# Patient Record
Sex: Male | Born: 1945 | Race: White | Hispanic: No | Marital: Married
Health system: Southern US, Community
[De-identification: ages and names within clinical notes are randomized; demographics above are authoritative.]

## PROBLEM LIST (undated history)

## (undated) DIAGNOSIS — I509 Heart failure, unspecified: Secondary | ICD-10-CM

## (undated) DIAGNOSIS — K219 Gastro-esophageal reflux disease without esophagitis: Secondary | ICD-10-CM

## (undated) DIAGNOSIS — E785 Hyperlipidemia, unspecified: Secondary | ICD-10-CM

## (undated) DIAGNOSIS — I629 Nontraumatic intracranial hemorrhage, unspecified: Secondary | ICD-10-CM

## (undated) DIAGNOSIS — J189 Pneumonia, unspecified organism: Secondary | ICD-10-CM

## (undated) DIAGNOSIS — I4891 Unspecified atrial fibrillation: Secondary | ICD-10-CM

## (undated) DIAGNOSIS — E119 Type 2 diabetes mellitus without complications: Secondary | ICD-10-CM

## (undated) HISTORY — PX: TRACHEOSTOMY: SUR1362

---

## 2018-11-05 ENCOUNTER — Emergency Department (HOSPITAL_COMMUNITY): Payer: Medicare Other

## 2018-11-05 ENCOUNTER — Encounter (HOSPITAL_COMMUNITY): Payer: Self-pay | Admitting: *Deleted

## 2018-11-05 ENCOUNTER — Inpatient Hospital Stay (HOSPITAL_COMMUNITY): Payer: Medicare Other

## 2018-11-05 ENCOUNTER — Inpatient Hospital Stay (HOSPITAL_COMMUNITY)
Admission: EM | Admit: 2018-11-05 | Discharge: 2018-11-30 | DRG: 064 | Disposition: E | Payer: Medicare Other | Source: Skilled Nursing Facility | Attending: Neurology | Admitting: Neurology

## 2018-11-05 DIAGNOSIS — D649 Anemia, unspecified: Secondary | ICD-10-CM | POA: Diagnosis present

## 2018-11-05 DIAGNOSIS — L8990 Pressure ulcer of unspecified site, unspecified stage: Secondary | ICD-10-CM | POA: Insufficient documentation

## 2018-11-05 DIAGNOSIS — I611 Nontraumatic intracerebral hemorrhage in hemisphere, cortical: Secondary | ICD-10-CM | POA: Diagnosis present

## 2018-11-05 DIAGNOSIS — I610 Nontraumatic intracerebral hemorrhage in hemisphere, subcortical: Secondary | ICD-10-CM

## 2018-11-05 DIAGNOSIS — I615 Nontraumatic intracerebral hemorrhage, intraventricular: Secondary | ICD-10-CM | POA: Diagnosis present

## 2018-11-05 DIAGNOSIS — E785 Hyperlipidemia, unspecified: Secondary | ICD-10-CM | POA: Diagnosis present

## 2018-11-05 DIAGNOSIS — R1310 Dysphagia, unspecified: Secondary | ICD-10-CM | POA: Diagnosis present

## 2018-11-05 DIAGNOSIS — I959 Hypotension, unspecified: Secondary | ICD-10-CM | POA: Diagnosis present

## 2018-11-05 DIAGNOSIS — E119 Type 2 diabetes mellitus without complications: Secondary | ICD-10-CM | POA: Diagnosis present

## 2018-11-05 DIAGNOSIS — G9382 Brain death: Secondary | ICD-10-CM | POA: Diagnosis present

## 2018-11-05 DIAGNOSIS — I4891 Unspecified atrial fibrillation: Secondary | ICD-10-CM | POA: Diagnosis present

## 2018-11-05 DIAGNOSIS — R4182 Altered mental status, unspecified: Secondary | ICD-10-CM | POA: Diagnosis present

## 2018-11-05 DIAGNOSIS — I629 Nontraumatic intracranial hemorrhage, unspecified: Secondary | ICD-10-CM | POA: Diagnosis not present

## 2018-11-05 DIAGNOSIS — G935 Compression of brain: Secondary | ICD-10-CM | POA: Diagnosis present

## 2018-11-05 DIAGNOSIS — Z930 Tracheostomy status: Secondary | ICD-10-CM

## 2018-11-05 DIAGNOSIS — Z8673 Personal history of transient ischemic attack (TIA), and cerebral infarction without residual deficits: Secondary | ICD-10-CM

## 2018-11-05 DIAGNOSIS — J339 Nasal polyp, unspecified: Secondary | ICD-10-CM | POA: Diagnosis present

## 2018-11-05 DIAGNOSIS — L89150 Pressure ulcer of sacral region, unstageable: Secondary | ICD-10-CM | POA: Diagnosis present

## 2018-11-05 DIAGNOSIS — I5040 Unspecified combined systolic (congestive) and diastolic (congestive) heart failure: Secondary | ICD-10-CM | POA: Diagnosis present

## 2018-11-05 DIAGNOSIS — J189 Pneumonia, unspecified organism: Secondary | ICD-10-CM | POA: Diagnosis present

## 2018-11-05 DIAGNOSIS — K219 Gastro-esophageal reflux disease without esophagitis: Secondary | ICD-10-CM | POA: Diagnosis present

## 2018-11-05 DIAGNOSIS — I110 Hypertensive heart disease with heart failure: Secondary | ICD-10-CM | POA: Diagnosis present

## 2018-11-05 DIAGNOSIS — E876 Hypokalemia: Secondary | ICD-10-CM | POA: Diagnosis present

## 2018-11-05 DIAGNOSIS — Z7901 Long term (current) use of anticoagulants: Secondary | ICD-10-CM | POA: Diagnosis not present

## 2018-11-05 DIAGNOSIS — Z66 Do not resuscitate: Secondary | ICD-10-CM | POA: Diagnosis present

## 2018-11-05 DIAGNOSIS — J9690 Respiratory failure, unspecified, unspecified whether with hypoxia or hypercapnia: Secondary | ICD-10-CM | POA: Diagnosis present

## 2018-11-05 DIAGNOSIS — G936 Cerebral edema: Secondary | ICD-10-CM | POA: Diagnosis present

## 2018-11-05 DIAGNOSIS — Z1159 Encounter for screening for other viral diseases: Secondary | ICD-10-CM

## 2018-11-05 DIAGNOSIS — Z794 Long term (current) use of insulin: Secondary | ICD-10-CM | POA: Diagnosis not present

## 2018-11-05 DIAGNOSIS — I619 Nontraumatic intracerebral hemorrhage, unspecified: Secondary | ICD-10-CM | POA: Diagnosis present

## 2018-11-05 HISTORY — DX: Hyperlipidemia, unspecified: E78.5

## 2018-11-05 HISTORY — DX: Heart failure, unspecified: I50.9

## 2018-11-05 HISTORY — DX: Unspecified atrial fibrillation: I48.91

## 2018-11-05 HISTORY — DX: Gastro-esophageal reflux disease without esophagitis: K21.9

## 2018-11-05 HISTORY — DX: Pneumonia, unspecified organism: J18.9

## 2018-11-05 HISTORY — DX: Nontraumatic intracranial hemorrhage, unspecified: I62.9

## 2018-11-05 HISTORY — DX: Type 2 diabetes mellitus without complications: E11.9

## 2018-11-05 LAB — CBC WITH DIFFERENTIAL/PLATELET
Abs Immature Granulocytes: 0.08 10*3/uL — ABNORMAL HIGH (ref 0.00–0.07)
Basophils Absolute: 0 10*3/uL (ref 0.0–0.1)
Basophils Relative: 0 %
Eosinophils Absolute: 0.1 10*3/uL (ref 0.0–0.5)
Eosinophils Relative: 1 %
HCT: 27.6 % — ABNORMAL LOW (ref 39.0–52.0)
Hemoglobin: 8.1 g/dL — ABNORMAL LOW (ref 13.0–17.0)
Immature Granulocytes: 1 %
Lymphocytes Relative: 10 %
Lymphs Abs: 1.2 10*3/uL (ref 0.7–4.0)
MCH: 25.2 pg — ABNORMAL LOW (ref 26.0–34.0)
MCHC: 29.3 g/dL — ABNORMAL LOW (ref 30.0–36.0)
MCV: 85.7 fL (ref 80.0–100.0)
Monocytes Absolute: 0.5 10*3/uL (ref 0.1–1.0)
Monocytes Relative: 5 %
Neutro Abs: 9.5 10*3/uL — ABNORMAL HIGH (ref 1.7–7.7)
Neutrophils Relative %: 83 %
Platelets: 275 10*3/uL (ref 150–400)
RBC: 3.22 MIL/uL — ABNORMAL LOW (ref 4.22–5.81)
RDW: 17.7 % — ABNORMAL HIGH (ref 11.5–15.5)
WBC: 11.4 10*3/uL — ABNORMAL HIGH (ref 4.0–10.5)
nRBC: 0 % (ref 0.0–0.2)

## 2018-11-05 LAB — CBG MONITORING, ED: Glucose-Capillary: 134 mg/dL — ABNORMAL HIGH (ref 70–99)

## 2018-11-05 LAB — COMPREHENSIVE METABOLIC PANEL
ALT: 22 U/L (ref 0–44)
AST: 21 U/L (ref 15–41)
Albumin: 1.9 g/dL — ABNORMAL LOW (ref 3.5–5.0)
Alkaline Phosphatase: 81 U/L (ref 38–126)
Anion gap: 9 (ref 5–15)
BUN: 17 mg/dL (ref 8–23)
CO2: 31 mmol/L (ref 22–32)
Calcium: 8.3 mg/dL — ABNORMAL LOW (ref 8.9–10.3)
Chloride: 96 mmol/L — ABNORMAL LOW (ref 98–111)
Creatinine, Ser: 0.81 mg/dL (ref 0.61–1.24)
GFR calc Af Amer: >60 mL/min (ref >60)
GFR calc non Af Amer: >60 mL/min (ref >60)
Glucose, Bld: 150 mg/dL — ABNORMAL HIGH (ref 70–99)
Potassium: 3.1 mmol/L — ABNORMAL LOW (ref 3.5–5.1)
Sodium: 136 mmol/L (ref 135–145)
Total Bilirubin: 0.3 mg/dL (ref 0.3–1.2)
Total Protein: 5.6 g/dL — ABNORMAL LOW (ref 6.5–8.1)

## 2018-11-05 LAB — SARS CORONAVIRUS 2 BY RT PCR (HOSPITAL ORDER, PERFORMED IN ~~LOC~~ HOSPITAL LAB): SARS Coronavirus 2: NEGATIVE

## 2018-11-05 LAB — LACTIC ACID, PLASMA
Lactic Acid, Venous: 1.1 mmol/L (ref 0.5–1.9)
Lactic Acid, Venous: 1.2 mmol/L (ref 0.5–1.9)

## 2018-11-05 LAB — MRSA PCR SCREENING: MRSA by PCR: NEGATIVE

## 2018-11-05 LAB — TSH: TSH: 0.591 u[IU]/mL (ref 0.350–4.500)

## 2018-11-05 MED ORDER — ACETAMINOPHEN 325 MG PO TABS: 650.0000 mg | ORAL_TABLET | Freq: Four times a day (QID) | ORAL | Status: DC | PRN

## 2018-11-05 MED ORDER — GLYCOPYRROLATE 1 MG PO TABS
1.0000 mg | ORAL_TABLET | ORAL | Status: DC | PRN
  Filled 2018-11-05: qty 1

## 2018-11-05 MED ORDER — MORPHINE SULFATE (PF) 2 MG/ML IV SOLN
1.0000 mg | INTRAVENOUS | Status: DC | PRN
  Filled 2018-11-05: qty 1

## 2018-11-05 MED ORDER — STROKE: EARLY STAGES OF RECOVERY BOOK
Freq: Once | Status: AC
  Administered 2018-11-05: 09:00:00

## 2018-11-05 MED ORDER — NOREPINEPHRINE 4 MG/250ML-% IV SOLN
0.0000 ug/min | INTRAVENOUS | Status: DC
  Administered 2018-11-05: 4 ug/min via INTRAVENOUS

## 2018-11-05 MED ORDER — SENNOSIDES-DOCUSATE SODIUM 8.6-50 MG PO TABS: 1.0000 | ORAL_TABLET | Freq: Two times a day (BID) | ORAL | Status: DC

## 2018-11-05 MED ORDER — GLYCOPYRROLATE 0.2 MG/ML IJ SOLN: 0.2000 mg | INTRAMUSCULAR | Status: DC | PRN

## 2018-11-05 MED ORDER — NOREPINEPHRINE 4 MG/250ML-% IV SOLN
INTRAVENOUS | Status: AC
  Administered 2018-11-05: 4 ug/min via INTRAVENOUS
  Filled 2018-11-05: qty 250

## 2018-11-05 MED ORDER — ACETAMINOPHEN 650 MG RE SUPP: 650.0000 mg | Freq: Four times a day (QID) | RECTAL | Status: DC | PRN

## 2018-11-05 MED ORDER — ACETAMINOPHEN 650 MG RE SUPP: 650.0000 mg | RECTAL | Status: DC | PRN

## 2018-11-05 MED ORDER — ACETAMINOPHEN 325 MG PO TABS: 650.0000 mg | ORAL_TABLET | ORAL | Status: DC | PRN

## 2018-11-05 MED ORDER — DIPHENHYDRAMINE HCL 50 MG/ML IJ SOLN: 25.0000 mg | INTRAMUSCULAR | Status: DC | PRN

## 2018-11-05 MED ORDER — COLLAGENASE 250 UNIT/GM EX OINT
TOPICAL_OINTMENT | Freq: Every day | CUTANEOUS | Status: DC
  Filled 2018-11-05: qty 30

## 2018-11-05 MED ORDER — SODIUM CHLORIDE 0.9 % IV BOLUS
1000.0000 mL | Freq: Once | INTRAVENOUS | Status: AC
  Administered 2018-11-05: 1000 mL via INTRAVENOUS

## 2018-11-05 MED ORDER — SODIUM CHLORIDE 0.9 % IV SOLN
2.0000 g | Freq: Once | INTRAVENOUS | Status: AC
  Administered 2018-11-05: 2 g via INTRAVENOUS
  Filled 2018-11-05: qty 2

## 2018-11-05 MED ORDER — VANCOMYCIN HCL 10 G IV SOLR
2000.0000 mg | Freq: Once | INTRAVENOUS | Status: AC
  Administered 2018-11-05: 07:00:00 2000 mg via INTRAVENOUS
  Filled 2018-11-05: qty 2000

## 2018-11-05 MED ORDER — PANTOPRAZOLE SODIUM 40 MG IV SOLR: 40.0000 mg | Freq: Every day | INTRAVENOUS | Status: DC

## 2018-11-05 MED ORDER — CHLORHEXIDINE GLUCONATE CLOTH 2 % EX PADS
6.0000 | MEDICATED_PAD | Freq: Every day | CUTANEOUS | Status: DC
  Administered 2018-11-05: 6 via TOPICAL

## 2018-11-05 MED ORDER — SODIUM CHLORIDE 0.9 % IV BOLUS
1000.0000 mL | Freq: Once | INTRAVENOUS | Status: AC
  Administered 2018-11-05: 05:00:00 1000 mL via INTRAVENOUS

## 2018-11-05 MED ORDER — POLYVINYL ALCOHOL 1.4 % OP SOLN
1.0000 [drp] | Freq: Four times a day (QID) | OPHTHALMIC | Status: DC | PRN
  Filled 2018-11-05: qty 15

## 2018-11-05 MED ORDER — VANCOMYCIN HCL IN DEXTROSE 1-5 GM/200ML-% IV SOLN: 1000.0000 mg | Freq: Once | INTRAVENOUS | Status: DC

## 2018-11-05 MED ORDER — SODIUM CHLORIDE 0.9 % IV SOLN
2.0000 g | Freq: Three times a day (TID) | INTRAVENOUS | Status: DC
  Filled 2018-11-05 (×3): qty 2

## 2018-11-05 MED ORDER — ACETAMINOPHEN 160 MG/5ML PO SOLN: 650.0000 mg | ORAL | Status: DC | PRN

## 2018-11-06 MED FILL — Norepinephrine Bitartrate IV Soln 1 MG/ML (Base Equivalent): INTRAVENOUS | Qty: 4 | Status: AC

## 2018-11-10 LAB — CULTURE, BLOOD (ROUTINE X 2)
Culture: NO GROWTH
Culture: NO GROWTH
Special Requests: ADEQUATE

## 2018-11-30 NOTE — Progress Notes (Signed)
SLP Cancellation Note  Patient Details Name: Gordon Lewis. MRN: [id number] DOB: Sep 22, 1945   Cancelled treatment:       Reason Eval/Treat Not Completed: Medical issues which prohibited therapy(Pt is currently intubated. SLP will follow up. )  Belal Scallon I. Hardin Negus, Berthold, Damon Office number [phone] Pager [place] 2018/11/16, 8:11 AM

## 2018-11-30 NOTE — Progress Notes (Signed)
Pt transported to 4N from ED via the ventilator. No complications during travel. Pt now in room 4N28.

## 2018-11-30 NOTE — ED Provider Notes (Signed)
[facility] Provider Note   CSN: [id number] Arrival date & time: 11/02/2018  0353    History   Chief Complaint Chief Complaint  Patient presents with   Altered Mental Status    HPI Gordon GritJohn Schrager Jr. is a 73 y.o. male.     HPI  This is a 73 year old male with a history of atrial fibrillation, heart failure, diabetes, hyperlipidemia, and intracranial hemorrhage who presents with altered mental status.  Patient lives at a long-term care facility, [facility].  He is trach dependent.  Normally he is responsive per the staff but they noted tonight that he was unresponsive.  He is currently being treated for pneumonia with vancomycin and meropenem.  He has had fevers on and off.  This is all per report and chart review.  Staff had noted that normally he is tachycardic but his pulse was 62 upon their arrival.  It appears that he was admitted to [facility] on June 17.  Confirmed that he is currently taking Eliquis.  I have reviewed limited available records.  Patient is unable to give any history.  Level 5 caveat.  Past Medical History:  Diagnosis Date   Atrial fibrillation (HCC)    CHF (congestive heart failure) (HCC)    Diabetes mellitus without complication (HCC)    GERD (gastroesophageal reflux disease)    Hyperlipidemia    Intracranial hemorrhage (HCC)    Pneumonia     Patient Active Problem List   Diagnosis Date Noted   ICH (intracerebral hemorrhage) (HCC) 11/16/2018      Home Medications    Prior to Admission medications   Medication Sig Start Date End Date Taking? Authorizing Provider  acetaminophen (TYLENOL) 325 MG tablet Place 650 mg into feeding tube every 4 (four) hours as needed for mild pain or fever.   Yes [provider]  amLODipine (NORVASC) 5 MG tablet Place 5 mg into feeding tube at bedtime.   Yes [provider]  apixaban (ELIQUIS) 5 MG TABS tablet Place 5 mg into feeding tube 2 (two) times daily.    Yes [provider]  atorvastatin (LIPITOR) 80 MG tablet Place 80 mg into feeding tube daily.   Yes [provider]  bisacodyl (DULCOLAX) 10 MG suppository Place 10 mg rectally See admin instructions. Every 2 days as needed for constipation   Yes [provider]  bromocriptine (PARLODEL) 2.5 MG tablet Place 2.5 mg into feeding tube every 8 (eight) hours as needed (neurostorming symptoms).   Yes [provider]  chlorhexidine (PERIDEX) 0.12 % solution Use as directed 5 mLs in the mouth or throat See admin instructions. By shift   Yes [provider]  digoxin (LANOXIN) 0.125 MG tablet Place 0.125 mg into feeding tube daily.   Yes [provider]  famotidine (PEPCID) 20 MG tablet Place 20 mg into feeding tube 2 (two) times daily.   Yes [provider]  FLUoxetine (PROZAC) 20 MG tablet Place 20 mg into feeding tube daily.   Yes [provider]  insulin glargine (LANTUS) 100 UNIT/ML injection Inject 5 Units into the skin daily.   Yes [provider]  insulin lispro (HUMALOG KWIKPEN) 100 UNIT/ML KwikPen Inject 3-20 Units into the skin every 6 (six) hours. 151-200=3 units 201-300=5 units 301-400=10 units 401-500=15 units 501-550=20 units   Yes [provider]  insulin lispro (INSULIN LISPRO) 100 UNIT/ML KwikPen Junior Inject 3 Units into the skin every 6 (six) hours.   Yes [provider]  Magnesium Oxide 400 (240 Mg) MG TABS Give 800 mg by tube 3 (three) times daily.   Yes [provider]  meropenem (MERREM) 1 g injection Inject 1 g into the muscle every 8 (eight) hours.   Yes [provider]  Multiple Vitamin (MULTIVITAMIN WITH MINERALS) TABS tablet Place 1 tablet into feeding tube daily.   Yes [provider]  Nutritional Supplements (PEPTAMEN AF PO) 55 mLs by Gastric Tube route See admin instructions. 0.0756 gram-1.2 kcal/ml Every shift   Yes [provider]  omega-3  acid ethyl esters (LOVAZA) 1 g capsule Place 1 g into feeding tube daily.   Yes [provider]  phenytoin (DILANTIN) 30 MG ER capsule 150 mg every 8 (eight) hours. Per Tube   Yes [provider]  Potassium Bicarb-Citric Acid (EFFER-K) 20 MEQ TBEF Give 20 mEq by tube daily.   Yes [provider]  propranolol (INDERAL) 40 MG tablet Place 40 mg into feeding tube every 6 (six) hours.   Yes [provider]  senna (SENOKOT) 8.6 MG tablet Place 2 tablets into feeding tube daily.   Yes [provider]  Vancomycin (VANCOCIN) 1.25 g SOLR injection Inject 1,250 mg into the vein 2 (two) times a day.   Yes [provider]  vitamin C (ASCORBIC ACID) 500 MG tablet Place 500 mg into feeding tube daily.   Yes [provider]  Jay Schlichter Oil (ARTIFICIAL TEARS) 83-15 % OINT Place 1 application into both eyes 2 (two) times a day.   Yes [provider]    Family History No family history on file.  Social History Social History   Tobacco Use   Smoking status: Not on file  Substance Use Topics   Alcohol use: Not on file   Drug use: Not on file     Allergies   Patient has no known allergies.   Review of Systems Review of Systems  Unable to perform ROS: Patient unresponsive     Physical Exam Updated Vital Signs BP 104/66    Pulse (!) 56    Temp (!) 93.7 F (34.3 C) (Rectal)    Resp 12    Wt 118.4 kg    SpO2 98%   Physical Exam Vitals signs and nursing note reviewed.  Constitutional:      Appearance: He is well-developed.     Comments: Unarousable, chronically ill-appearing  HENT:     Head: Normocephalic and atraumatic.     Mouth/Throat:     Mouth: Mucous membranes are dry.  Eyes:     Pupils: Pupils are equal, round, and reactive to light.     Comments: Pupils 5 mm and minimally reactive bilaterally  Neck:     Musculoskeletal: Neck supple.     Comments: Trach in place Cardiovascular:     Rate and  Rhythm: Regular rhythm. Bradycardia present.     Heart sounds: Normal heart sounds. No murmur.  Pulmonary:     Effort: Pulmonary effort is normal. No respiratory distress.     Breath sounds: Normal breath sounds. No wheezing.     Comments: On ventilator Abdominal:     General: Bowel sounds are normal.     Palpations: Abdomen is soft.     Tenderness: There is no abdominal tenderness. There is no rebound.  Musculoskeletal:     Right lower leg: Edema present.     Left lower leg: Edema present.  Skin:    General: Skin is warm and dry.  Neurological:  Comments: Somnolent, unarousable, does not withdrawal to painful stimuli, absent corneal  Psychiatric:     Comments: Unable to assess      ED Treatments / Results  Labs (all labs ordered are listed, but only abnormal results are displayed) Labs Reviewed  CBC WITH DIFFERENTIAL/PLATELET - Abnormal; Notable for the following components:      Result Value   WBC 11.4 (*)    RBC 3.22 (*)    Hemoglobin 8.1 (*)    HCT 27.6 (*)    MCH 25.2 (*)    MCHC 29.3 (*)    RDW 17.7 (*)    Neutro Abs 9.5 (*)    Abs Immature Granulocytes 0.08 (*)    All other components within normal limits  COMPREHENSIVE METABOLIC PANEL - Abnormal; Notable for the following components:   Potassium 3.1 (*)    Chloride 96 (*)    Glucose, Bld 150 (*)    Calcium 8.3 (*)    Total Protein 5.6 (*)    Albumin 1.9 (*)    All other components within normal limits  CBG MONITORING, ED - Abnormal; Notable for the following components:   Glucose-Capillary 134 (*)    All other components within normal limits  SARS CORONAVIRUS 2 (HOSPITAL ORDER, PERFORMED IN [facility])  CULTURE, BLOOD (ROUTINE X 2)  CULTURE, BLOOD (ROUTINE X 2)  LACTIC ACID, PLASMA  TSH  URINALYSIS, ROUTINE W REFLEX MICROSCOPIC  LACTIC ACID, PLASMA    EKG EKG Interpretation  Date/Time:  Tuesday November 05 2018 04:00:45 EDT Ventricular Rate:  55 PR Interval:    QRS  Duration: 223 QT Interval:  524 QTC Calculation: 502 R Axis:   99 Text Interpretation:  Atrial fibrillation RBBB and LPFB ST depr, consider ischemia, inferior leads No prior for comparison Confirmed by Ross MarcusHorton, Avory Rahimi ([id number]) on 11/20/2018 4:23:25 AM Also confirmed by Ross MarcusHorton, Dalary Hollar ([id number]), editor Barbette Hairassel, Kerry [phone]  on 11/27/2018 7:16:16 AM   Radiology Ct Head Wo Contrast  Result Date: 11/14/2018 CLINICAL DATA:  Found unresponsive at [facility]. Recent brain hemorrhage. Altered level of consciousness. EXAM: CT HEAD WITHOUT CONTRAST TECHNIQUE: Contiguous axial images were obtained from the base of the skull through the vertex without intravenous contrast. COMPARISON:  None. FINDINGS: Brain: Large parenchymal hemorrhage is present within the right corona radiata extending from the corpus callosum to the vertex measuring 6.6 x 6.4 x 6.8 cm. Hemorrhage extends into the corpus callosum and throughout the ventricles. Midline shift superiorly is up to 14 mm. There is marked effacement of the right lateral ventricle. Blood extends through the fourth ventricle and to the foramen magnum. There is diffuse effacement of the subarachnoid spaces. Asymmetric diffuse white matter edema surrounds the hemorrhage on the right. Basal cisterns are effaced. There is downward herniation of tissue into the foramen magnum. Vascular: Atherosclerotic calcifications are present within the cavernous internal carotid arteries bilaterally. There is no hyperdense vessel. Skull: Calvarium is intact. No focal lytic or blastic lesions are present. Sinuses/Orbits: Maxillary sinuses are opacified bilaterally. There is asymmetric opacification of anterior ethmoid air cells and the right frontal sinus. Polypoid lesions are present in the nasal cavity. Sphenoid sinuses are clear. Bilateral mastoid effusions are present, right greater than left. Right middle ear effusion is also present. IMPRESSION: 1. Large parenchymal hemorrhage involving the  right corona radiata and centrum semi ovale with extension into the corpus callosum and ventricles. 2. Midline shift measures up to 14 mm. 3. Diffuse effacement of extra-axial spaces and downward herniation of tissue  into the foramen magnum. 4. Asymmetric edema surrounding the hemorrhage in the right hemisphere. 5. Chronic sinus disease. 6. Nasal polyposis. Critical Value/emergent results were called by telephone at the time of interpretation on 11/12/2018 at 6:31 am to Dr. Ross MarcusOURTNEY Girtrude Enslin , who verbally acknowledged these results. Electronically Signed   By: Marin Robertshristopher  Mattern M.D.   On: Mar 16, 2019 06:32   Dg Chest Portable 1 View  Result Date: 11/17/2018 CLINICAL DATA:  Pneumonia. EXAM: PORTABLE CHEST 1 VIEW COMPARISON:  None. FINDINGS: Tracheostomy tube in place. PICC tip at the cavoatrial junction. Cardiomegaly. Density at the left lung base probably represents a combination of pleural effusion and lung consolidation. Right lung is clear. Multiple dense nodules at the left hilum could represent calcified granulomas. No acute bone abnormality. IMPRESSION: Consolidation/effusion at the left lung base. Electronically Signed   By: Francene BoyersJames  Maxwell M.D.   On: Mar 16, 2019 06:58    Procedures Procedures (including critical care time)  CRITICAL CARE Performed by: Shon Batonourtney F Ahtziri Jeffries   Total critical care time: 70 minutes  Critical care time was exclusive of separately billable procedures and treating other patients.  Critical care was necessary to treat or prevent imminent or life-threatening deterioration.  Critical care was time spent personally by me on the following activities: development of treatment plan with patient and/or surrogate as well as nursing, discussions with consultants, evaluation of patient's response to treatment, examination of patient, obtaining history from patient or surrogate, ordering and performing treatments and interventions, ordering and review of laboratory studies,  ordering and review of radiographic studies, pulse oximetry and re-evaluation of patient's condition.   Medications Ordered in ED Medications  vancomycin (VANCOCIN) 2,000 mg in sodium chloride 0.9 % 500 mL IVPB (2,000 mg Intravenous New Bag/Given 11/06/2018 0631)   stroke: mapping our early stages of recovery book (has no administration in time range)  acetaminophen (TYLENOL) tablet 650 mg (has no administration in time range)    Or  acetaminophen (TYLENOL) solution 650 mg (has no administration in time range)    Or  acetaminophen (TYLENOL) suppository 650 mg (has no administration in time range)  senna-docusate (Senokot-S) tablet 1 tablet (has no administration in time range)  pantoprazole (PROTONIX) injection 40 mg (has no administration in time range)  sodium chloride 0.9 % bolus 1,000 mL (0 mLs Intravenous Stopped 11/04/2018 0449)  ceFEPIme (MAXIPIME) 2 g in sodium chloride 0.9 % 100 mL IVPB (0 g Intravenous Stopped 11/18/2018 0548)     Initial Impression / Assessment and Plan / ED Course  I have reviewed the triage vital signs and the nursing notes.  Pertinent labs & imaging results that were available during my care of the patient were reviewed by me and considered in my medical decision making (see chart for details).        Patient presents with worsening altered mental status.  On my exam he is obtunded and unresponsive with a very poor neurologic exam.  He is currently being treated for pneumonia.  He was noted to be bradycardic hypothermic.  Broad work-up initiated including sepsis work-up and repeat CT scan of the head given history of bleed.  I received a phone call from radiology.  Patient with a massive intracranial bleed with significant midline shift and herniation.  I discussed the patient with Dr. Maurice Smallstergard, neurosurgery who states that this is not a survivable injury and not amenable to neurosurgery.  Neurology was also consulted.  Indication for [facility] given neurologic exam.   Their exam is consistent with brain  death.  I have kept the family updated.  I spoke to the patient's daughter and son-in-law.  I was unable to get up with his wife but they are contacting her.  I discussed with them his poor prognosis and likely that this was an end-of-life event.  They have expressed that they would like everything done to save his life.  I have discussed this with the neurology team.  Family is in route to the hospital.  However, they have a 5-hour drive. Wife Gordon Lewis) [phone] Daughter Gordon Lewis) [phone] SIL Gordon Lewis) [phone] (preferred contact)  Final Clinical Impressions(s) / ED Diagnoses   Final diagnoses:  Intracranial hemorrhage [facility])    ED Discharge Orders    None       Shon Baton, MD 2018-11-28 [phone]

## 2018-11-30 NOTE — ED Triage Notes (Signed)
Pt arrives from [facility] via [facility]. Pt has been at [facility] after sustaining a fall +head bleed. Pt has been being treated for PNA with vanc. Tonight, on rounds, pt was unresponsive, hr 50-60's (normal 130's) & BP in the 60's. Levophed was initiated at 15mcg into Right PICC. #22 in the right hand (NSL). Pt is vent dependent trach, sats have been 98-99% on vent. Pt is cool to touch, unknown temp. Pt has PEG, foley.

## 2018-11-30 NOTE — Progress Notes (Addendum)
Patient arrived to 4N28 from ED around 0800. On assessment patient did not respond to pain, pupils non reactive, GCS 3.  CDS notified.  Patient found to have a unstagable wound on sacral.  Patient from [facility] with chronic Trach, foley and PEG.  See documentation. Wound consult placed.  Clarified with Stroke MD about blood pressure parameter, systolic less than 972.  Stroke MD waiting for family to arrive to talk about goals of care.  RN to continue to monitor.

## 2018-11-30 NOTE — Progress Notes (Signed)
Pt taken off of ventilator for comfort care per family request and physician order. Pt suctioned via trach prior to cuff being deflated. Family remained at bedside. Pt tolerated fairly well. SpO2, HR and RR acceptable at this time but declined as anticipated.

## 2018-11-30 NOTE — Progress Notes (Signed)
TCD completed. Tamar Miano, BS, RDMS, RVT  

## 2018-11-30 NOTE — ED Notes (Signed)
Per CT, CT room will not be ready until 5:30am.

## 2018-11-30 NOTE — Progress Notes (Signed)
STROKE TEAM PROGRESS NOTE   INTERVAL HISTORY I have personally reviewed history of presenting illness via review of electronic medical records and imaging films in PACS.  I also spoke to the patient's wife initially over the phone and subsequently at the bedside after arrival and her daughter and son-in-law.  He unfortunately has had a devastating intracerebral hemorrhage with significant mass-effect midline shift and brain herniation and with declining blood pressure and heart rate exam is nearing brain death.  Transcranial Doppler study was performed at the bedside and showed diastolic flow reversal in the middle cerebral artery as well as high resistance and M2 waveforms indicative of near brain death state  Vitals:   30-Nov-2018 1030 11-30-18 1045 2018-11-30 1100 Nov 30, 2018 1200  BP: 108/65 105/61 113/63 116/62  Pulse: (!) 47 (!) 50 (!) 55 (!) 53  Resp: '12 12 12 12  '$ Temp:    98.9 F (37.2 C)  TempSrc:    Axillary  SpO2: 100% 100% 100% 99%  Weight:        CBC:  Recent Labs  Lab 2018/11/30 0426  WBC 11.4*  NEUTROABS 9.5*  HGB 8.1*  HCT 27.6*  MCV 85.7  PLT 416    Basic Metabolic Panel:  Recent Labs  Lab 11-30-18 0426  NA 136  K 3.1*  CL 96*  CO2 31  GLUCOSE 150*  BUN 17  CREATININE 0.81  CALCIUM 8.3*    IMAGING Ct Head Wo Contrast  Result Date: 30-Nov-2018 CLINICAL DATA:  Found unresponsive at [place]. Recent brain hemorrhage. Altered level of consciousness. EXAM: CT HEAD WITHOUT CONTRAST TECHNIQUE: Contiguous axial images were obtained from the base of the skull through the vertex without intravenous contrast. COMPARISON:  None. FINDINGS: Brain: Large parenchymal hemorrhage is present within the right corona radiata extending from the corpus callosum to the vertex measuring 6.6 x 6.4 x 6.8 cm. Hemorrhage extends into the corpus callosum and throughout the ventricles. Midline shift superiorly is up to 14 mm. There is marked effacement of the right lateral ventricle. Blood extends  through the fourth ventricle and to the foramen magnum. There is diffuse effacement of the subarachnoid spaces. Asymmetric diffuse white matter edema surrounds the hemorrhage on the right. Basal cisterns are effaced. There is downward herniation of tissue into the foramen magnum. Vascular: Atherosclerotic calcifications are present within the cavernous internal carotid arteries bilaterally. There is no hyperdense vessel. Skull: Calvarium is intact. No focal lytic or blastic lesions are present. Sinuses/Orbits: Maxillary sinuses are opacified bilaterally. There is asymmetric opacification of anterior ethmoid air cells and the right frontal sinus. Polypoid lesions are present in the nasal cavity. Sphenoid sinuses are clear. Bilateral mastoid effusions are present, right greater than left. Right middle ear effusion is also present. IMPRESSION: 1. Large parenchymal hemorrhage involving the right corona radiata and centrum semi ovale with extension into the corpus callosum and ventricles. 2. Midline shift measures up to 14 mm. 3. Diffuse effacement of extra-axial spaces and downward herniation of tissue into the foramen magnum. 4. Asymmetric edema surrounding the hemorrhage in the right hemisphere. 5. Chronic sinus disease. 6. Nasal polyposis. Critical Value/emergent results were called by telephone at the time of interpretation on 11-30-2018 at 6:31 am to Dr. Thayer Jew , who verbally acknowledged these results. Electronically Signed   By: San Morelle M.D.   On: 11-30-18 06:32   Dg Chest Portable 1 View  Result Date: 11-30-2018 CLINICAL DATA:  Pneumonia. EXAM: PORTABLE CHEST 1 VIEW COMPARISON:  None. FINDINGS: Tracheostomy tube in place.  PICC tip at the cavoatrial junction. Cardiomegaly. Density at the left lung base probably represents a combination of pleural effusion and lung consolidation. Right lung is clear. Multiple dense nodules at the left hilum could represent calcified granulomas. No acute  bone abnormality. IMPRESSION: Consolidation/effusion at the left lung base. Electronically Signed   By: Lorriane Shire M.D.   On: 2018/11/09 06:58   Vas [place] Transcranial Doppler  Result Date: 09-Nov-2018  Transcranial Doppler Indications: Hemorrhagic stroke. History: Large parenchymal hemorrhage involving the right corona radiata and centrum semi ovale with extension into the corpus callosum and ventricles. Midline shift measures up to 14 mm. Performing Technologist: June Leap RDMS, RVT Supporting Technologist: Abram Sander RVS  Examination Guidelines: A complete evaluation includes B-mode imaging, spectral Doppler, color Doppler, and power Doppler as needed of all accessible portions of each vessel. Bilateral testing is considered an integral part of a complete examination. Limited examinations for reoccurring indications may be performed as noted.  +----------+------------+----------+-----------+-------------------------------+ RIGHT TCD   Right VM  Depth (cm)Pulsatility            Comment                           (cm)                                                         +----------+------------+----------+-----------+-------------------------------+ MCA          70.00                            high resistant with flow                                                           reversal             +----------+------------+----------+-----------+-------------------------------+ Opthalmic    20.00                                 high resistant          +----------+------------+----------+-----------+-------------------------------+ ICA siphon   20.00                                 high resistant          +----------+------------+----------+-----------+-------------------------------+  +----------+------------+---------+-----------+--------------------------------+ LEFT TCD  Left VM (cm)  Depth  Pulsatility            Comment                                       (cm)                                               +----------+------------+---------+-----------+--------------------------------+ ICA siphon   60.00  high resistant with flow                                                           reversal             +----------+------------+---------+-----------+--------------------------------+ Distal ICA   50.00                                 high resistant          +----------+------------+---------+-----------+--------------------------------+  Summary:  Not all vessels could be insonated but highly resistant low amplitude waveforms throughout.Abnormal right MCA waveform with flow revesal in diatole indicates significantly elevated pulsatility indices and near cerebral circiulatory arrest situation. *See table(s) above for measurements and observations.  Diagnosing physician: Antony Contras MD Electronically signed by Antony Contras MD on 12/03/18 at 12:01:31 PM.    Final     PHYSICAL EXAM Elderly Caucasian male who has tracheostomy and is on a ventilator.  He is unresponsive. . Afebrile. Head is nontraumatic. Neck is supple without bruit.    Cardiac exam no murmur or gallop. Lungs are clear to auscultation. Distal pulses are well felt. Neurological Exam :  Patient is intubated not sedated.  Comatose and unresponsive.  Eyes are closed.  Pupils are both large 7 mm fixed nonreactive.  Doll's eye movements absent.  Corneal reflexes absent bilaterally.  Patient has no cough and gag.  No response to sternal rub or nailbed pressure.  No voluntary or involuntary motor response noted.  Plantars are both mute.  ASSESSMENT/PLAN Mr. Gordon Lewis. is a 73 y.o. 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 fib previously on anticoagulation presenting from [facility] being unresponsive with fixed and dilated pupils.   Stroke: Large right ICH with IVH and midline shift with cytotoxic cerebral edema and herniation,  not survivable - hemorrhage occurred while on Eliquis for known A. fib status post ICH in May 2020  CT head large right corona radiata and centrum semiovale ICH with extension into the corpus callosum and ventricles.  Midline shift 14 mm.  Effacement of extra-axial spaces and downward herniation into the foramen magnum.  Asymmetric edema surrounding hemorrhage and right hemisphere.  Chronic sinus disease.  Nasal polyposis.  Neurosurgery consulted.  Not a surgical candidate   Anticoagulation not reversed as this hemorrhage is not survivable  Progressive herniation with hypotension since admission  TCD shows flow reversal in the vessels  Per [facility] spoke with wife who made him DNR  Treat for survival until family arrival  SCDs for VTE prophylaxis  Eliquis (apixaban) daily prior to admission, now on No antithrombotic given hmg  Wife and family currently on the way to the hospital and should be here within the next 2-1/2 to 3 hours  Vent dependent Respiratory Failure  Intubated  Atrial Fibrillation  Home anticoagulation:  Eliquis (apixaban) daily   Off AC d/t hmg   Hypertension Hypotension  SBP goal < 140  Fluid bolus  Levophed for low BP  Hyperlipidemia  Home meds:  lipitor 80, lovaza  Diabetes type II   Home meds:  lanuts 5, SSI  CBGs  SSI  Dysphagia . Secondary to stroke . NPO  Other Stroke Risk Factors  Advanced age  Obesity  Hx stroke/TIA  07/2018 - R frontoparietal ICH w/ IVH on xarelto, reversed w/ TXA, Andexxa. D/c'd 14 days on eliquis. Sacral wound.   Combined systolic and diastolic congestive heart failure  Other Active Problems  Leukocytosis WBC 11.4  Anemia hemoglobin 8.1  Hypokalemia K 3.1  Hospital day # 0  I have personally obtained history,examined this patient, reviewed notes, independently viewed imaging studies, participated in medical decision making and plan of care.ROS completed by me personally and pertinent positives  fully documented  I have made any additions or clarifications directly to the above note.  Patient unfortunately has had a massive intracerebral hemorrhage with transtentorial, transfalcine  And central herniation with brainstem compression with poor neurological exam compatible with near brain death exam and declining vital signs.  I had a long discussion with patient's wife initially over the phone and subsequently met with her at the bedside upon arrival and explained poor prognosis and she agreed to DNR and withdrawal of ventilatory support.  This will be done according to protocol and patient will be kept comfortable. This patient is critically ill and at significant risk of neurological worsening, death and care requires constant monitoring of vital signs, hemodynamics,respiratory and cardiac monitoring, extensive review of multiple databases, frequent neurological assessment, discussion with family, other specialists and medical decision making of high complexity.I have made any additions or clarifications directly to the above note.This critical care time does not reflect procedure time, or teaching time or supervisory time of PA/NP/Med Resident etc but could involve care discussion time.  I spent 40 minutes of neurocritical care time  in the care of  this patient.     Antony Contras, MD Medical Director [facility] Pager: [phone] 11/09/2018 1:43 PM   To contact Stroke Continuity provider, please refer to [email or web address] After hours, contact General Neurology

## 2018-11-30 NOTE — H&P (Signed)
Chief Complaint: Found Unresponsive  History obtained from: Chart  And ED physician  HPI:                                                                                                                                       Gordon GritJohn Vanderveer Jr. is a 73 y.o. male with past medical history of a recent frontal hemorrhage, atrial fibrillation previously on anticoagulation,  status post [facility]ach [facility]  [facility] after being found unresponsive.  History was obtained by reviewing [facility].  Patient was discharged from outside hospital following ICH while on Xarelto.  He was discharged to [facility] on June 17 and Eliquis was restarted at the time of admission.  Patient was found unresponsive earlier today and EMS was called.  Last known normal unclear.  On arrival patient had fixed and dilated pupils, completely unresponsive and not breathing over the ventilator.  A stat CT head was performed which showed a very large right parenchymal hemorrhage with intraventricular extension and 14 mm midline shift and downward tonsillar herniation.  EDP consulted neurosurgery who felt this was nonsurvivable and not a candidate for any neurosurgical intervention.  Upon assessment, patient comatose with absent cranial nerve reflexes (caoloric test not performed), flaccid in all 4 extremities.  Patient was not breathing over the vent.  Anticoagulation not reversed as this is nonsurvivable and would be futile care.  Date last known well: 7.6.20 Time last known well: Unknown tPA Given: No, hemorrhage   Intracerebral Hemorrhage (ICH) Score  Glascow Coma Score  3-4  +2  Age >/= 80 yes no 0  ICH volume >/= 30ml  yes +1  IVH yes +1  Infratentorial origin no 0 Total:  4   Past Medical History:  Diagnosis Date  . Atrial fibrillation (HCC)   . CHF (congestive heart failure) (HCC)   . Diabetes mellitus without complication (HCC)   . GERD  (gastroesophageal reflux disease)   . Hyperlipidemia   . Intracranial hemorrhage (HCC)   . Pneumonia       No family history on file. No pertinent family history to patient's presentation. Social History:  has no history on file for tobacco, alcohol, and drug.  Allergies: No Known Allergies  Medications:  I reviewed medications [facility]   ROS:                                                                                                                                     Unable to obtain as patient is unresponsive   Examination:                                                                                                      General: Appears well-developed . Psych: Unable to obtain as patient is comatose Eyes: No scleral injection HENT: No OP obstrucion, trach in place Head: Normocephalic.  Cardiovascular: Bradycardic with regular rhythm. Respiratory: No spontaneous effort GI: Soft.  No distension. There is no tenderness.  Skin: WDI    Neurological Examination Mental Status: Patient does not respond to verbal stimuli.  Does not respond to deep sternal rub.  Does not follow commands.  No verbalizations noted.  Cranial Nerves: II: patient does not respond confrontation bilaterally, pupils not reactive, right 8 mm, left 8 mm III,IV,VI: doll's response absent bilaterally.  V,VII: corneal reflex: absent VIII: patient does not respond to verbal stimuli IX,X: gag reflex : absent XI: trapezius strength unable to test bilaterally XII: tongue strength unable to test Motor: Extremities flaccid throughout.  No spontaneous movement noted.  No purposeful movements noted.  Sensory: Does not respond to noxious stimuli in any extremity. Deep Tendon Reflexes:  Absent throughout. Plantars: mute Cerebellar: Unable to perform     Lab  Results: Basic Metabolic Panel: Recent Labs  Lab 2018/06/26 0426  NA 136  K 3.1*  CL 96*  CO2 31  GLUCOSE 150*  BUN 17  CREATININE 0.81  CALCIUM 8.3*    CBC: Recent Labs  Lab 2018/06/26 0426  WBC 11.4*  NEUTROABS 9.5*  HGB 8.1*  HCT 27.6*  MCV 85.7  PLT 275    Coagulation Studies: No results for input(s): LABPROT, INR in the last 72 hours.  Imaging: Ct Head Wo Contrast  Result Date: 11/19/2018 CLINICAL DATA:  Found unresponsive at [facility]. Recent brain hemorrhage. Altered level of consciousness. EXAM: CT HEAD WITHOUT CONTRAST TECHNIQUE: Contiguous axial images were obtained from the base of the skull through the vertex without intravenous contrast. COMPARISON:  None. FINDINGS: Brain: Large parenchymal hemorrhage is present within the right corona radiata extending from the corpus callosum to the vertex measuring 6.6 x 6.4 x 6.8 cm. Hemorrhage extends into the corpus callosum and throughout the ventricles. Midline shift superiorly is up to  14 mm. There is marked effacement of the right lateral ventricle. Blood extends through the fourth ventricle and to the foramen magnum. There is diffuse effacement of the subarachnoid spaces. Asymmetric diffuse white matter edema surrounds the hemorrhage on the right. Basal cisterns are effaced. There is downward herniation of tissue into the foramen magnum. Vascular: Atherosclerotic calcifications are present within the cavernous internal carotid arteries bilaterally. There is no hyperdense vessel. Skull: Calvarium is intact. No focal lytic or blastic lesions are present. Sinuses/Orbits: Maxillary sinuses are opacified bilaterally. There is asymmetric opacification of anterior ethmoid air cells and the right frontal sinus. Polypoid lesions are present in the nasal cavity. Sphenoid sinuses are clear. Bilateral mastoid effusions are present, right greater than left. Right middle ear effusion is also present. IMPRESSION: 1. Large parenchymal hemorrhage  involving the right corona radiata and centrum semi ovale with extension into the corpus callosum and ventricles. 2. Midline shift measures up to 14 mm. 3. Diffuse effacement of extra-axial spaces and downward herniation of tissue into the foramen magnum. 4. Asymmetric edema surrounding the hemorrhage in the right hemisphere. 5. Chronic sinus disease. 6. Nasal polyposis. Critical Value/emergent results were called by telephone at the time of interpretation on 11-07-18 at 6:31 am to Dr. Thayer Jew , who verbally acknowledged these results. Electronically Signed   By: San Morelle M.D.   On: 11-07-2018 06:32   Dg Chest Portable 1 View  Result Date: 2018/11/07 CLINICAL DATA:  Pneumonia. EXAM: PORTABLE CHEST 1 VIEW COMPARISON:  None. FINDINGS: Tracheostomy tube in place. PICC tip at the cavoatrial junction. Cardiomegaly. Density at the left lung base probably represents a combination of pleural effusion and lung consolidation. Right lung is clear. Multiple dense nodules at the left hilum could represent calcified granulomas. No acute bone abnormality. IMPRESSION: Consolidation/effusion at the left lung base. Electronically Signed   By: Lorriane Shire M.D.   On: November 07, 2018 06:58     ASSESSMENT AND PLAN   Large intraparenchymal hemorrhage in the right corona radiata with intraventricular extension including the fourth ventricle, 14 mm midline shift and diffuse cerebral edema with downward tonsillar herniation  Stroke is non survivable, patient already herniated Anticoagulation not reversed as this would be futile care Neurosurgery consulted, no role for neurosurgical intervention given poor prognosis Goals of care discussion when family arrives.    This patient is neurologically critically ill due to large [place] with IVH, cerebral edema and tonsillar herniation.  Patient is very high risk of death.   I spent 40  minutes of neurocritical time in the care of this patient including discussing  poor prognosis with the family (spoke to daughter over the phone and family is on their way).        [facility] Pager Number [phone]

## 2018-11-30 NOTE — Discharge Summary (Signed)
Patient ID: Gordon Lewis. MRN: [id number] DOB/AGE: 1946/01/11 73 y.o.  Admit date: 12-03-18 Death date: 12/03/2018 at 41  Admission Diagnoses: Unresponsive  Cause of Death:   Large right ICH with IVH and midline shift with cytotoxic cerebral edema and herniation, not survivable - hemorrhage occurred while on Eliquis for known A. fib patient made DNR and comfort care by family and ventilatory support withdrawn  Pertinent Medical Diagnosis: Active Problems:   ICH (intracerebral hemorrhage) ([place])   Pressure injury of skin   Hospital Course: Gordon Lewis. is a 73 y.o. 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 fib previously on anticoagulation presenting from [place] being unresponsive with fixed and dilated pupils. Patient was discharged from outside hospital following [place] while on Xarelto.  He was discharged to [place] on June 17 and Eliquis was restarted at the time of admission.  Patient was found unresponsive earlier today and EMS was called.  Last known normal unclear.  On arrival patient had fixed and dilated pupils, completely unresponsive and not breathing over the ventilator.  A stat CT head was performed which showed a very large right parenchymal hemorrhage with intraventricular extension and 14 mm midline shift and downward tonsillar herniation.  EDP consulted neurosurgery who felt this was nonsurvivable and not a candidate for any neurosurgical intervention. Upon assessment, patient comatose with absent cranial nerve reflexes (caoloric test not performed), flaccid in all 4 extremities.  Patient was not breathing over the vent.  Anticoagulation not reversed as this is nonsurvivable and would be futile care.  ICH score was 4 on admission I also spoke to the patient's wife initially over the phone and subsequently at the bedside after arrival and her daughter and son-in-law.  He unfortunately has had a devastating intracerebral hemorrhage with significant  mass-effect midline shift and brain herniation and with declining blood pressure and heart rate exam is nearing brain death.  Transcranial Doppler study was performed at the bedside and showed diastolic flow reversal in the middle cerebral artery as well as high resistance and M2 waveforms indicative of near brain death state.  Family understood poor prognosis and agreed to DNR and withdrawal of ventilatory support which was done and patient was kept comfortable and passed away shortly thereafter. Signed: Antony Contras 11/13/2018, 9:18 AM

## 2018-11-30 NOTE — Consult Note (Signed)
[facility] Nurse wound consult note Reason for Consult: Consult requested for sacrum wound.  Pt is critically ill with multiple systemic factors with can impair healing. 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  Pt is frequently incontinent of loose stools and it is difficult to keep area from becoming soiled Conservative sharp wound debridement (CSWD performed at the bedside): Using scissors and forcepts, removed outer layer of loose slough/eschar, revealing further layer of slough beneath with patchy areas of red moist woundbed. Pt tolerated without apparent discomfort and no bleeding. Dressing procedure/placement/frequency:  PT to begin hydrotherapy to assist with removal of nonviable tissue.  Pt is on an air mattress to reduce pressure. Apply Santyl to sacrum wound Q day, then cover with moist gauze packing and foam dressing.  [place] team will reassess weekly with PT during hydrotherapy. Julien Girt MSN, RN, [place], [place], [place]

## 2018-11-30 NOTE — ED Notes (Signed)
RN and pt back from CT 

## 2018-11-30 NOTE — Progress Notes (Signed)
Called and updated CDS.  RN to continue to monitor.

## 2018-11-30 NOTE — Progress Notes (Signed)
Patient's time of death 8416 pronounce by Morey Hummingbird and [facility] RN.   Family at bedside.  Attending MD and CDS notified.

## 2018-11-30 NOTE — Progress Notes (Signed)
Patient became hypotensive, neurology MD notified.  MD came to assess patient and updated family.  Verbal order for levophed and 1 liter bolus.  Code status changed per neurology MD.  RN to continue to monitor.

## 2018-11-30 DEATH — deceased

## 2021-02-04 IMAGING — CT CT HEAD WITHOUT CONTRAST
3 series · 14 of 47 positions shown, 16 images · non-contrast
Comparison: None.

CLINICAL DATA: Found unresponsive at [place]. Recent brain
hemorrhage. Altered level of consciousness.

EXAM:
CT HEAD WITHOUT CONTRAST
TECHNIQUE: Contiguous axial images were obtained from the base of the skull
through the vertex without intravenous contrast.

[Series 3: head 5.0 h30s · axial · 0.46mm/px · z∈[-119,+31]mm · 8 of 36 slices shown, 10 images]
[im 3/36  brain]
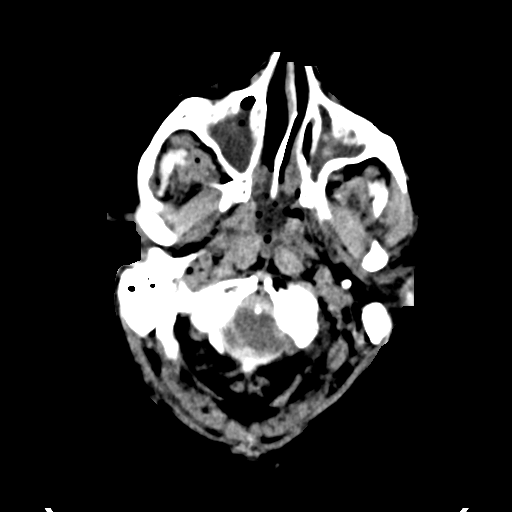
[im 3/36  bone]
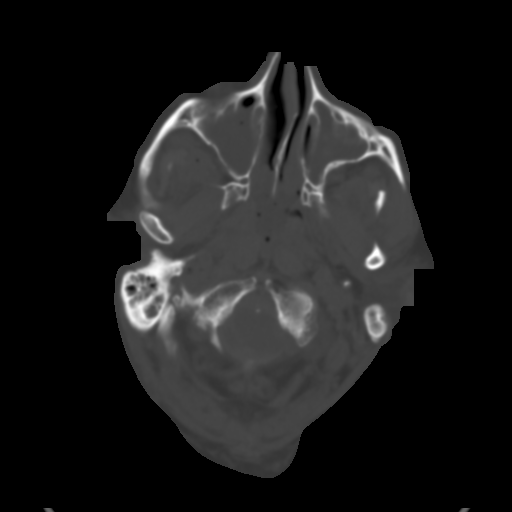
[im 8/36  brain]
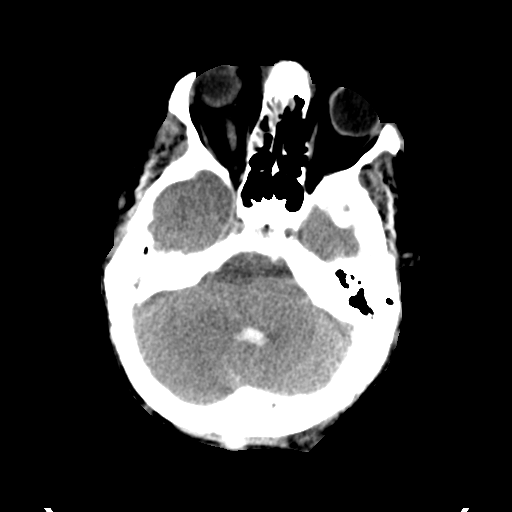
[im 11/36  brain]
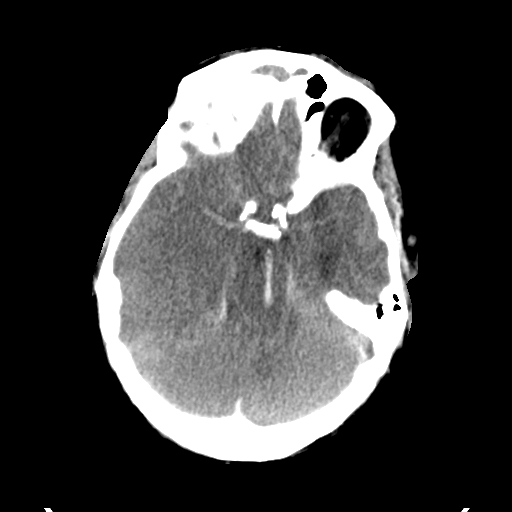
[im 16/36  brain]
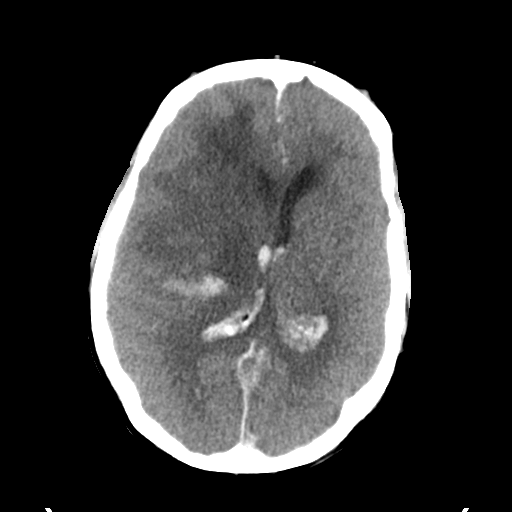
[im 20/36  brain]
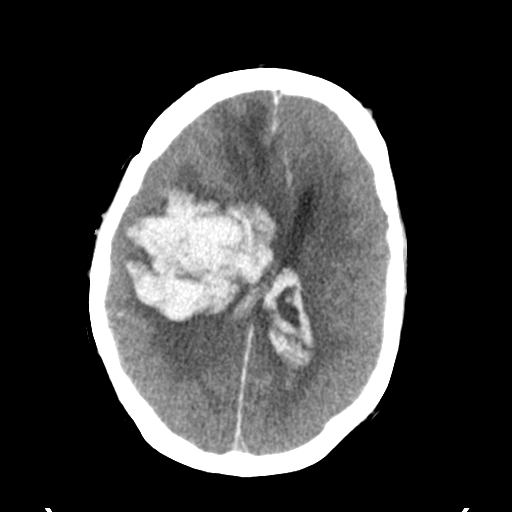
[im 20/36  bone]
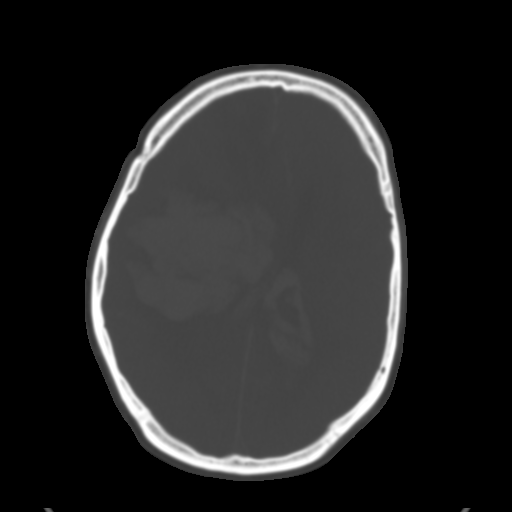
[im 25/36  brain]
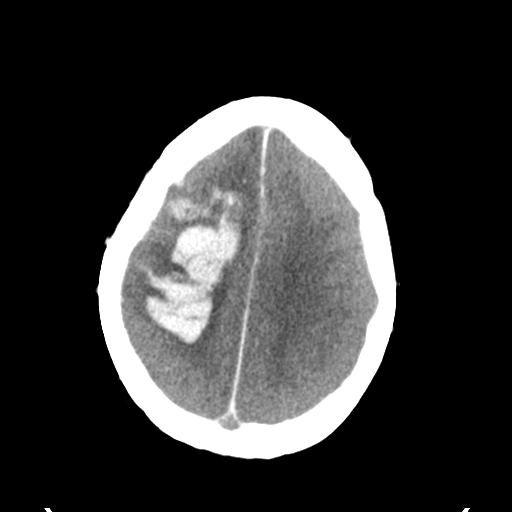
[im 28/36  brain]
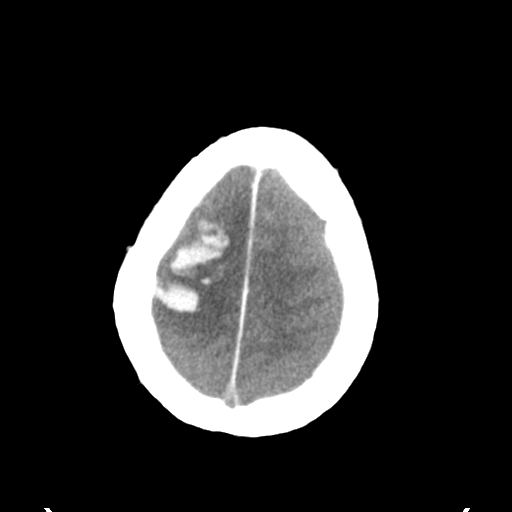
[im 33/36  brain]
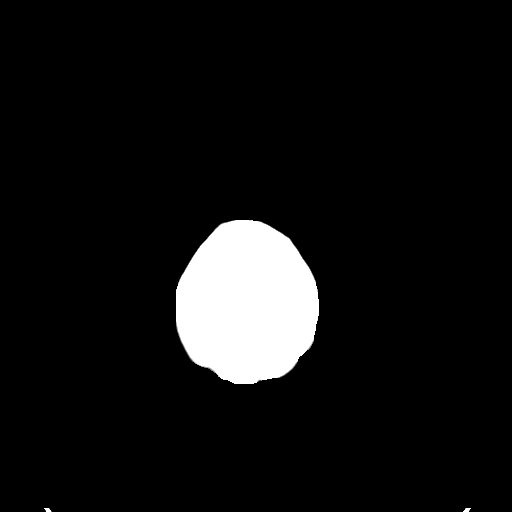

[Series 5: head 3.0 mpr cor · coronal · 0.37mm/px · 3 of 73 slices shown]
[im 25/73  brain]
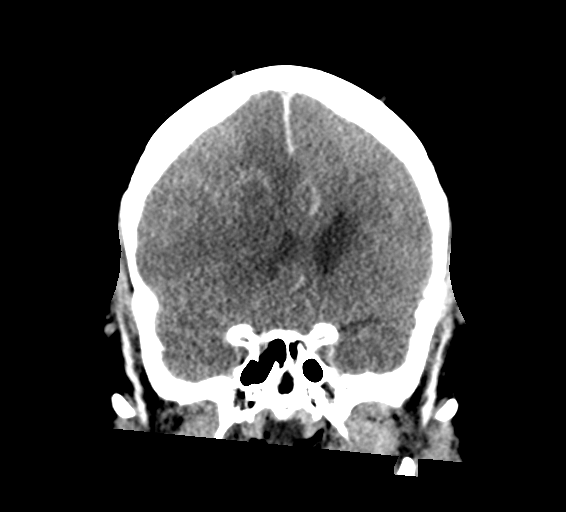
[im 33/73  brain]
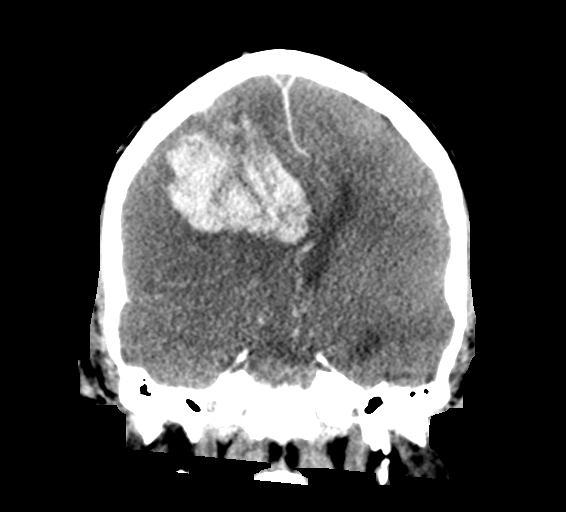
[im 41/73  brain]
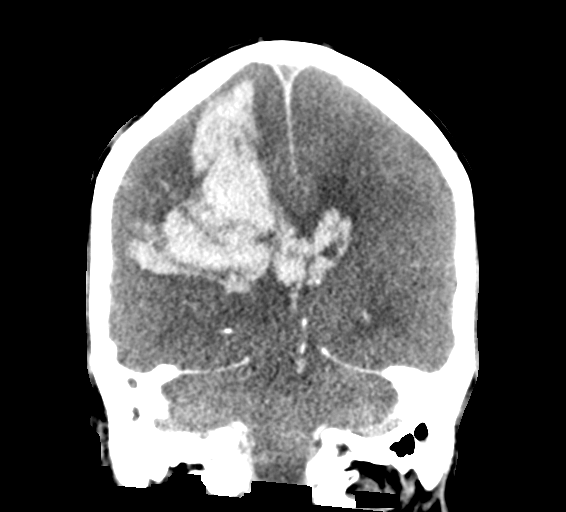

[Series 6: head 3.0 mpr sag · sagittal · 0.35mm/px · 3 of 63 slices shown]
[im 21/63  brain]
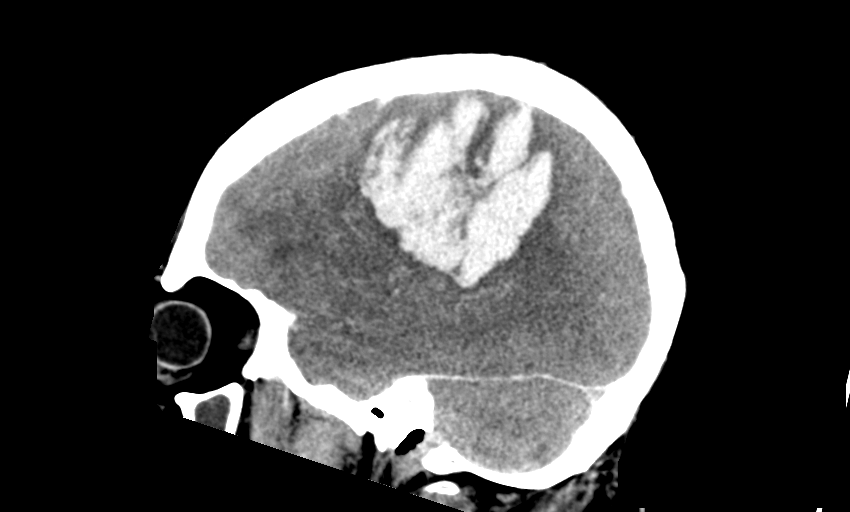
[im 32/63  brain]
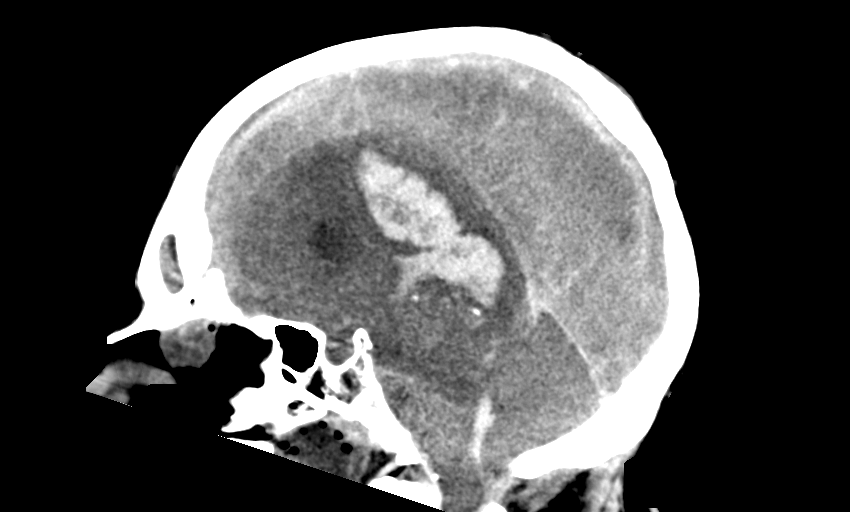
[im 42/63  brain]
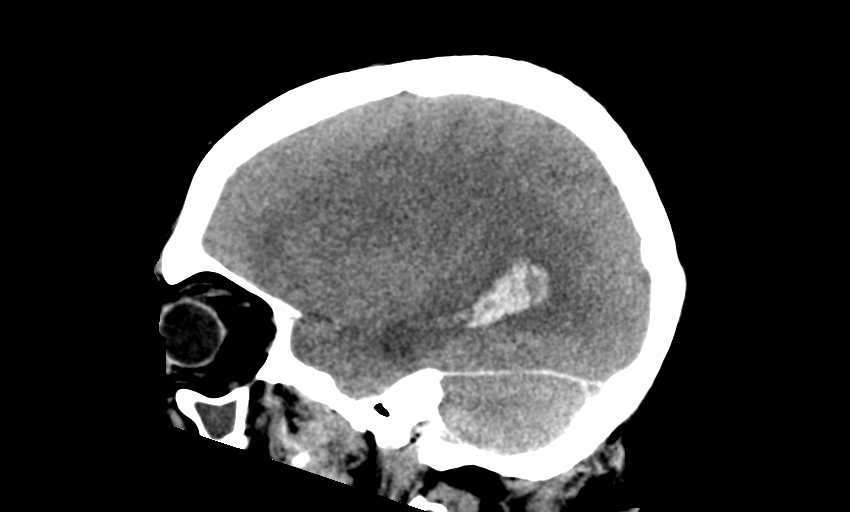

[14 of 47 positions shown; findings below may reference images not displayed]

FINDINGS: Brain: Large parenchymal hemorrhage is present within the right
corona radiata extending from the corpus callosum to the vertex
measuring 6.6 x 6.4 x 6.8 cm. Hemorrhage extends into the corpus
callosum and throughout the ventricles. Midline shift superiorly is
up to 14 mm. There is marked effacement of the right lateral
ventricle. Blood extends through the fourth ventricle and to the
foramen magnum.

There is diffuse effacement of the subarachnoid spaces. Asymmetric
diffuse white matter edema surrounds the hemorrhage on the right.
Basal cisterns are effaced. There is downward herniation of tissue
into the foramen magnum.

Vascular: Atherosclerotic calcifications are present within the
cavernous internal carotid arteries bilaterally. There is no
hyperdense vessel.

Skull: Calvarium is intact. No focal lytic or blastic lesions are
present.

Sinuses/Orbits: Maxillary sinuses are opacified bilaterally. There
is asymmetric opacification of anterior ethmoid air cells and the
right frontal sinus. Polypoid lesions are present in the nasal
cavity. Sphenoid sinuses are clear. Bilateral mastoid effusions are
present, right greater than left. Right middle ear effusion is also
present.
IMPRESSION: 1. Large parenchymal hemorrhage involving the right corona radiata
and centrum semi ovale with extension into the corpus callosum and
ventricles.
2. Midline shift measures up to 14 mm.
3. Diffuse effacement of extra-axial spaces and downward herniation
of tissue into the foramen magnum.
4. Asymmetric edema surrounding the hemorrhage in the right
hemisphere.
5. Chronic sinus disease.
6. Nasal polyposis.

Critical Value/emergent results were called by telephone at the time
of interpretation on 11/05/2018 at [DATE] to Dr. DOMENICO MARRONE ,
who verbally acknowledged these results.
